# Patient Record
Sex: Male | Born: 1950 | Race: White | Hispanic: No | Marital: Married | Smoking: Never smoker
Health system: Southern US, Community
[De-identification: ages and names within clinical notes are randomized; demographics above are authoritative.]

## PROBLEM LIST (undated history)

## (undated) DIAGNOSIS — E785 Hyperlipidemia, unspecified: Secondary | ICD-10-CM

## (undated) DIAGNOSIS — I2510 Atherosclerotic heart disease of native coronary artery without angina pectoris: Secondary | ICD-10-CM

## (undated) DIAGNOSIS — I2584 Coronary atherosclerosis due to calcified coronary lesion: Secondary | ICD-10-CM

## (undated) HISTORY — PX: SKIN CANCER EXCISION: SHX779

## (undated) HISTORY — DX: Hyperlipidemia, unspecified: E78.5

## (undated) HISTORY — DX: Atherosclerotic heart disease of native coronary artery without angina pectoris: I25.10

## (undated) HISTORY — DX: Coronary atherosclerosis due to calcified coronary lesion: I25.84

---
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
## 2001-03-28 ENCOUNTER — Emergency Department (HOSPITAL_COMMUNITY): Admission: EM | Admit: 2001-03-28 | Discharge: 2001-03-28 | Payer: Self-pay | Admitting: Emergency Medicine

## 2005-01-13 ENCOUNTER — Ambulatory Visit (HOSPITAL_COMMUNITY): Admission: RE | Admit: 2005-01-13 | Discharge: 2005-01-13 | Payer: Self-pay | Admitting: Gastroenterology

## 2010-12-11 ENCOUNTER — Encounter: Payer: Self-pay | Admitting: Gastroenterology

## 2012-01-04 ENCOUNTER — Other Ambulatory Visit: Payer: Self-pay | Admitting: Dermatology

## 2013-01-08 ENCOUNTER — Other Ambulatory Visit: Payer: Self-pay | Admitting: Dermatology

## 2013-02-14 ENCOUNTER — Other Ambulatory Visit: Payer: Self-pay | Admitting: Gastroenterology

## 2013-02-14 DIAGNOSIS — R1310 Dysphagia, unspecified: Secondary | ICD-10-CM

## 2013-02-27 ENCOUNTER — Ambulatory Visit
Admission: RE | Admit: 2013-02-27 | Discharge: 2013-02-27 | Disposition: A | Discharge status: Home / Self Care | Payer: BC Managed Care – PPO | Source: Ambulatory Visit | Attending: Gastroenterology | Admitting: Gastroenterology

## 2013-02-27 DIAGNOSIS — R1310 Dysphagia, unspecified: Secondary | ICD-10-CM

## 2013-12-10 ENCOUNTER — Encounter (INDEPENDENT_AMBULATORY_CARE_PROVIDER_SITE_OTHER): Payer: Self-pay

## 2013-12-10 ENCOUNTER — Encounter (INDEPENDENT_AMBULATORY_CARE_PROVIDER_SITE_OTHER): Payer: Self-pay | Admitting: General Surgery

## 2013-12-10 ENCOUNTER — Ambulatory Visit (INDEPENDENT_AMBULATORY_CARE_PROVIDER_SITE_OTHER): Payer: BC Managed Care – PPO | Admitting: General Surgery

## 2013-12-10 VITALS — BP 142/88 | HR 80 | Temp 98.0°F | Resp 14 | Ht 68.0 in | Wt 146.6 lb

## 2013-12-10 DIAGNOSIS — K4090 Unilateral inguinal hernia, without obstruction or gangrene, not specified as recurrent: Secondary | ICD-10-CM

## 2013-12-10 NOTE — Progress Notes (Signed)
Patient ID: Roger PoppDale B Nerio, male   DOB: 12/18/1950, 63 y.o.   MRN: [id number]  Chief Complaint  Patient presents with  . New Evaluation    eval RIH    HPI Roger Lynch is a 63 y.o. male.  We are asked to see the patient in consultation by Dr. Rodrigo RanMark Perini to evaluate him for a right inguinal hernia. The patient is a 63 year old white male who frequently works out at [employer]. In December he was doing a abdominal work out and it was shortly after this that he noticed a bulge in the right groin. He has had some mild discomfort associated with it. He denies any nausea or vomiting. He has been wearing a truss to keep it in and this seems to have worked well for him HPI  Past Medical History  Diagnosis Date  . Hyperlipidemia     History reviewed. No pertinent past surgical history.  Family History  Problem Relation Age of Onset  . Heart disease Father   . Cancer Paternal Grandmother     stomach    Social History History  Substance Use Topics  . Smoking status: Never Smoker   . Smokeless tobacco: Never Used  . Alcohol Use: 0.6 oz/week    1 Glasses of wine per week     Comment: daily    Allergies  Allergen Reactions  . Erythromycin Nausea Only  . Hydrocodone Nausea Only    Current Outpatient Prescriptions  Medication Sig Dispense Refill  . aspirin 81 MG tablet Take 81 mg by mouth daily.      . Coenzyme Q10 (CO Q-10) 100 MG CAPS Take 200 mg by mouth.      Marland Kitchen. DHEA 50 MG TABS Take by mouth.      . Ginseng (GIN-ZING) 100 MG CAPS Take 200 mg by mouth.      . Multiple Vitamins-Minerals (MULTIVITAMIN PO) Take by mouth.      . Omega-3 Fatty Acids (FISH OIL) 1000 MG CAPS Take by mouth.      . Red Yeast Rice 600 MG TABS Take 1,200 mg by mouth.      . Zinc 100 MG TABS Take by mouth.       No current facility-administered medications for this visit.    Review of Systems Review of Systems  Constitutional: Negative.   HENT: Negative.   Eyes: Negative.   Respiratory: Negative.    Cardiovascular: Negative.   Gastrointestinal: Negative.   Endocrine: Negative.   Genitourinary: Negative.   Musculoskeletal: Negative.   Skin: Negative.   Allergic/Immunologic: Negative.   Neurological: Negative.   Hematological: Negative.   Psychiatric/Behavioral: Negative.     Blood pressure 142/88, pulse 80, temperature 98 F (36.7 C), temperature source Temporal, resp. rate 14, height 5\' 8"  (1.727 m), weight 146 lb 9.6 oz (66.497 kg).  Physical Exam Physical Exam  Constitutional: He is oriented to person, place, and time. He appears well-developed and well-nourished.  HENT:  Head: Normocephalic and atraumatic.  Eyes: Conjunctivae and EOM are normal. Pupils are equal, round, and reactive to light.  Neck: Normal range of motion. Neck supple.  Cardiovascular: Normal rate, regular rhythm and normal heart sounds.   Pulmonary/Chest: Effort normal and breath sounds normal.  Abdominal: Soft. Bowel sounds are normal.  Genitourinary:  There is a small but reducible right inguinal hernia. There is no palpable bulge or impulse with straining in the left groin.  Musculoskeletal: Normal range of motion.  Neurological: He is alert and oriented  to person, place, and time.  Skin: Skin is warm and dry.  Psychiatric: He has a normal mood and affect. His behavior is normal.    Data Reviewed As above  Assessment    The patient appears to have a small but symptomatic right inguinal hernia. Because of the risk of incarceration And strangulation I think he would benefit from having a hernia fixed. He would also like to have this done. I've discussed with him in detail the risks and benefits of the operation to fix the hernia as well some of the technical aspects and he understands and wishes to proceed     Plan    Plan for right inguinal hernia repair with mesh        TOTH III,PAUL S 12/10/2013, 12:17 PM

## 2014-01-08 ENCOUNTER — Other Ambulatory Visit: Payer: Self-pay | Admitting: Dermatology

## 2014-01-22 DIAGNOSIS — K4090 Unilateral inguinal hernia, without obstruction or gangrene, not specified as recurrent: Secondary | ICD-10-CM

## 2014-01-23 ENCOUNTER — Other Ambulatory Visit (INDEPENDENT_AMBULATORY_CARE_PROVIDER_SITE_OTHER): Payer: Self-pay | Admitting: *Deleted

## 2014-01-23 MED ORDER — OXYCODONE-ACETAMINOPHEN 5-325 MG PO TABS: 1.0000 | ORAL_TABLET | ORAL | Status: AC | PRN

## 2014-02-03 ENCOUNTER — Encounter (INDEPENDENT_AMBULATORY_CARE_PROVIDER_SITE_OTHER): Payer: BC Managed Care – PPO | Admitting: General Surgery

## 2014-11-21 HISTORY — PX: HERNIA REPAIR: SHX51

## 2015-03-13 ENCOUNTER — Other Ambulatory Visit: Payer: Self-pay | Admitting: Dermatology

## 2015-12-29 DIAGNOSIS — E784 Other hyperlipidemia: Secondary | ICD-10-CM | POA: Diagnosis not present

## 2016-03-23 DIAGNOSIS — H52223 Regular astigmatism, bilateral: Secondary | ICD-10-CM | POA: Diagnosis not present

## 2016-03-23 DIAGNOSIS — H5211 Myopia, right eye: Secondary | ICD-10-CM | POA: Diagnosis not present

## 2016-03-23 DIAGNOSIS — H1789 Other corneal scars and opacities: Secondary | ICD-10-CM | POA: Diagnosis not present

## 2016-03-23 DIAGNOSIS — H5202 Hypermetropia, left eye: Secondary | ICD-10-CM | POA: Diagnosis not present

## 2016-07-13 DIAGNOSIS — Z125 Encounter for screening for malignant neoplasm of prostate: Secondary | ICD-10-CM | POA: Diagnosis not present

## 2016-07-13 DIAGNOSIS — D2271 Melanocytic nevi of right lower limb, including hip: Secondary | ICD-10-CM | POA: Diagnosis not present

## 2016-07-13 DIAGNOSIS — Z85828 Personal history of other malignant neoplasm of skin: Secondary | ICD-10-CM | POA: Diagnosis not present

## 2016-07-13 DIAGNOSIS — D1801 Hemangioma of skin and subcutaneous tissue: Secondary | ICD-10-CM | POA: Diagnosis not present

## 2016-07-13 DIAGNOSIS — D2262 Melanocytic nevi of left upper limb, including shoulder: Secondary | ICD-10-CM | POA: Diagnosis not present

## 2016-07-13 DIAGNOSIS — L570 Actinic keratosis: Secondary | ICD-10-CM | POA: Diagnosis not present

## 2016-07-13 DIAGNOSIS — E784 Other hyperlipidemia: Secondary | ICD-10-CM | POA: Diagnosis not present

## 2016-07-13 DIAGNOSIS — D692 Other nonthrombocytopenic purpura: Secondary | ICD-10-CM | POA: Diagnosis not present

## 2016-07-13 DIAGNOSIS — L720 Epidermal cyst: Secondary | ICD-10-CM | POA: Diagnosis not present

## 2016-07-13 DIAGNOSIS — D225 Melanocytic nevi of trunk: Secondary | ICD-10-CM | POA: Diagnosis not present

## 2016-07-13 DIAGNOSIS — L821 Other seborrheic keratosis: Secondary | ICD-10-CM | POA: Diagnosis not present

## 2016-07-13 DIAGNOSIS — D2272 Melanocytic nevi of left lower limb, including hip: Secondary | ICD-10-CM | POA: Diagnosis not present

## 2016-07-13 DIAGNOSIS — R7301 Impaired fasting glucose: Secondary | ICD-10-CM | POA: Diagnosis not present

## 2016-07-20 DIAGNOSIS — N401 Enlarged prostate with lower urinary tract symptoms: Secondary | ICD-10-CM | POA: Diagnosis not present

## 2016-07-20 DIAGNOSIS — R0683 Snoring: Secondary | ICD-10-CM | POA: Diagnosis not present

## 2016-07-20 DIAGNOSIS — K219 Gastro-esophageal reflux disease without esophagitis: Secondary | ICD-10-CM | POA: Diagnosis not present

## 2016-07-20 DIAGNOSIS — E784 Other hyperlipidemia: Secondary | ICD-10-CM | POA: Diagnosis not present

## 2016-07-20 DIAGNOSIS — R7301 Impaired fasting glucose: Secondary | ICD-10-CM | POA: Diagnosis not present

## 2016-07-20 DIAGNOSIS — R011 Cardiac murmur, unspecified: Secondary | ICD-10-CM | POA: Diagnosis not present

## 2016-07-20 DIAGNOSIS — R601 Generalized edema: Secondary | ICD-10-CM | POA: Diagnosis not present

## 2016-07-20 DIAGNOSIS — Z0000 Encounter for general adult medical examination without abnormal findings: Secondary | ICD-10-CM | POA: Diagnosis not present

## 2016-07-20 DIAGNOSIS — N528 Other male erectile dysfunction: Secondary | ICD-10-CM | POA: Diagnosis not present

## 2016-07-20 DIAGNOSIS — G4709 Other insomnia: Secondary | ICD-10-CM | POA: Diagnosis not present

## 2016-07-22 DIAGNOSIS — Z1212 Encounter for screening for malignant neoplasm of rectum: Secondary | ICD-10-CM | POA: Diagnosis not present

## 2016-07-26 ENCOUNTER — Other Ambulatory Visit: Payer: Self-pay | Admitting: Internal Medicine

## 2016-07-26 DIAGNOSIS — R011 Cardiac murmur, unspecified: Secondary | ICD-10-CM

## 2016-08-04 ENCOUNTER — Other Ambulatory Visit (HOSPITAL_COMMUNITY): Payer: Self-pay

## 2016-08-15 ENCOUNTER — Ambulatory Visit (HOSPITAL_COMMUNITY): Payer: Medicare Other | Attending: Cardiovascular Disease

## 2016-08-15 ENCOUNTER — Other Ambulatory Visit: Payer: Self-pay

## 2016-08-15 DIAGNOSIS — I351 Nonrheumatic aortic (valve) insufficiency: Secondary | ICD-10-CM | POA: Insufficient documentation

## 2016-08-15 DIAGNOSIS — E785 Hyperlipidemia, unspecified: Secondary | ICD-10-CM | POA: Diagnosis not present

## 2016-08-15 DIAGNOSIS — Z8249 Family history of ischemic heart disease and other diseases of the circulatory system: Secondary | ICD-10-CM | POA: Insufficient documentation

## 2016-08-15 DIAGNOSIS — R011 Cardiac murmur, unspecified: Secondary | ICD-10-CM | POA: Diagnosis not present

## 2016-12-19 DIAGNOSIS — Z23 Encounter for immunization: Secondary | ICD-10-CM | POA: Diagnosis not present

## 2017-07-07 DIAGNOSIS — H52223 Regular astigmatism, bilateral: Secondary | ICD-10-CM | POA: Diagnosis not present

## 2017-07-07 DIAGNOSIS — H5213 Myopia, bilateral: Secondary | ICD-10-CM | POA: Diagnosis not present

## 2017-07-07 DIAGNOSIS — H43393 Other vitreous opacities, bilateral: Secondary | ICD-10-CM | POA: Diagnosis not present

## 2017-07-07 DIAGNOSIS — H2513 Age-related nuclear cataract, bilateral: Secondary | ICD-10-CM | POA: Diagnosis not present

## 2017-08-25 DIAGNOSIS — L821 Other seborrheic keratosis: Secondary | ICD-10-CM | POA: Diagnosis not present

## 2017-08-25 DIAGNOSIS — D2262 Melanocytic nevi of left upper limb, including shoulder: Secondary | ICD-10-CM | POA: Diagnosis not present

## 2017-08-25 DIAGNOSIS — C44519 Basal cell carcinoma of skin of other part of trunk: Secondary | ICD-10-CM | POA: Diagnosis not present

## 2017-08-25 DIAGNOSIS — D1801 Hemangioma of skin and subcutaneous tissue: Secondary | ICD-10-CM | POA: Diagnosis not present

## 2017-08-25 DIAGNOSIS — Z85828 Personal history of other malignant neoplasm of skin: Secondary | ICD-10-CM | POA: Diagnosis not present

## 2017-09-05 DIAGNOSIS — R7301 Impaired fasting glucose: Secondary | ICD-10-CM | POA: Diagnosis not present

## 2017-09-05 DIAGNOSIS — Z125 Encounter for screening for malignant neoplasm of prostate: Secondary | ICD-10-CM | POA: Diagnosis not present

## 2017-09-05 DIAGNOSIS — E7849 Other hyperlipidemia: Secondary | ICD-10-CM | POA: Diagnosis not present

## 2017-09-12 DIAGNOSIS — N401 Enlarged prostate with lower urinary tract symptoms: Secondary | ICD-10-CM | POA: Diagnosis not present

## 2017-09-12 DIAGNOSIS — R0683 Snoring: Secondary | ICD-10-CM | POA: Diagnosis not present

## 2017-09-12 DIAGNOSIS — Z0000 Encounter for general adult medical examination without abnormal findings: Secondary | ICD-10-CM | POA: Diagnosis not present

## 2017-09-12 DIAGNOSIS — R011 Cardiac murmur, unspecified: Secondary | ICD-10-CM | POA: Diagnosis not present

## 2017-09-25 DIAGNOSIS — Z1212 Encounter for screening for malignant neoplasm of rectum: Secondary | ICD-10-CM | POA: Diagnosis not present

## 2017-12-27 DIAGNOSIS — Z85828 Personal history of other malignant neoplasm of skin: Secondary | ICD-10-CM | POA: Diagnosis not present

## 2017-12-27 DIAGNOSIS — L570 Actinic keratosis: Secondary | ICD-10-CM | POA: Diagnosis not present

## 2018-03-22 DIAGNOSIS — E7849 Other hyperlipidemia: Secondary | ICD-10-CM | POA: Diagnosis not present

## 2018-08-15 DIAGNOSIS — H52223 Regular astigmatism, bilateral: Secondary | ICD-10-CM | POA: Diagnosis not present

## 2018-08-15 DIAGNOSIS — H43393 Other vitreous opacities, bilateral: Secondary | ICD-10-CM | POA: Diagnosis not present

## 2018-08-15 DIAGNOSIS — H5202 Hypermetropia, left eye: Secondary | ICD-10-CM | POA: Diagnosis not present

## 2018-08-15 DIAGNOSIS — H1789 Other corneal scars and opacities: Secondary | ICD-10-CM | POA: Diagnosis not present

## 2018-10-04 DIAGNOSIS — Z125 Encounter for screening for malignant neoplasm of prostate: Secondary | ICD-10-CM | POA: Diagnosis not present

## 2018-10-04 DIAGNOSIS — R82998 Other abnormal findings in urine: Secondary | ICD-10-CM | POA: Diagnosis not present

## 2018-10-04 DIAGNOSIS — R7301 Impaired fasting glucose: Secondary | ICD-10-CM | POA: Diagnosis not present

## 2018-10-04 DIAGNOSIS — E7849 Other hyperlipidemia: Secondary | ICD-10-CM | POA: Diagnosis not present

## 2018-10-10 DIAGNOSIS — Z85828 Personal history of other malignant neoplasm of skin: Secondary | ICD-10-CM | POA: Diagnosis not present

## 2018-10-10 DIAGNOSIS — L821 Other seborrheic keratosis: Secondary | ICD-10-CM | POA: Diagnosis not present

## 2018-10-10 DIAGNOSIS — L720 Epidermal cyst: Secondary | ICD-10-CM | POA: Diagnosis not present

## 2018-10-10 DIAGNOSIS — L738 Other specified follicular disorders: Secondary | ICD-10-CM | POA: Diagnosis not present

## 2018-10-11 ENCOUNTER — Other Ambulatory Visit: Payer: Self-pay | Admitting: Internal Medicine

## 2018-10-11 DIAGNOSIS — L570 Actinic keratosis: Secondary | ICD-10-CM | POA: Diagnosis not present

## 2018-10-11 DIAGNOSIS — E785 Hyperlipidemia, unspecified: Secondary | ICD-10-CM

## 2018-10-11 DIAGNOSIS — Z0000 Encounter for general adult medical examination without abnormal findings: Secondary | ICD-10-CM | POA: Diagnosis not present

## 2018-10-11 DIAGNOSIS — N401 Enlarged prostate with lower urinary tract symptoms: Secondary | ICD-10-CM | POA: Diagnosis not present

## 2018-10-11 DIAGNOSIS — Z1212 Encounter for screening for malignant neoplasm of rectum: Secondary | ICD-10-CM | POA: Diagnosis not present

## 2018-10-11 DIAGNOSIS — R600 Localized edema: Secondary | ICD-10-CM | POA: Diagnosis not present

## 2018-10-17 ENCOUNTER — Other Ambulatory Visit: Payer: Medicare Other

## 2018-10-22 ENCOUNTER — Ambulatory Visit
Admission: RE | Admit: 2018-10-22 | Discharge: 2018-10-22 | Disposition: A | Discharge status: Home / Self Care | Payer: No Typology Code available for payment source | Source: Ambulatory Visit | Attending: Internal Medicine | Admitting: Internal Medicine

## 2018-10-22 DIAGNOSIS — E785 Hyperlipidemia, unspecified: Secondary | ICD-10-CM

## 2018-10-24 DIAGNOSIS — I2510 Atherosclerotic heart disease of native coronary artery without angina pectoris: Secondary | ICD-10-CM | POA: Diagnosis not present

## 2018-10-24 DIAGNOSIS — R0989 Other specified symptoms and signs involving the circulatory and respiratory systems: Secondary | ICD-10-CM | POA: Diagnosis not present

## 2018-10-24 DIAGNOSIS — E782 Mixed hyperlipidemia: Secondary | ICD-10-CM | POA: Diagnosis not present

## 2018-10-24 DIAGNOSIS — I340 Nonrheumatic mitral (valve) insufficiency: Secondary | ICD-10-CM | POA: Diagnosis not present

## 2018-10-25 DIAGNOSIS — E559 Vitamin D deficiency, unspecified: Secondary | ICD-10-CM | POA: Diagnosis not present

## 2018-12-03 DIAGNOSIS — I2510 Atherosclerotic heart disease of native coronary artery without angina pectoris: Secondary | ICD-10-CM | POA: Diagnosis not present

## 2018-12-04 DIAGNOSIS — R011 Cardiac murmur, unspecified: Secondary | ICD-10-CM | POA: Diagnosis not present

## 2018-12-04 DIAGNOSIS — R0989 Other specified symptoms and signs involving the circulatory and respiratory systems: Secondary | ICD-10-CM | POA: Diagnosis not present

## 2018-12-10 DIAGNOSIS — I2510 Atherosclerotic heart disease of native coronary artery without angina pectoris: Secondary | ICD-10-CM | POA: Diagnosis not present

## 2018-12-10 DIAGNOSIS — I6523 Occlusion and stenosis of bilateral carotid arteries: Secondary | ICD-10-CM | POA: Diagnosis not present

## 2018-12-10 DIAGNOSIS — E782 Mixed hyperlipidemia: Secondary | ICD-10-CM | POA: Diagnosis not present

## 2018-12-10 DIAGNOSIS — I340 Nonrheumatic mitral (valve) insufficiency: Secondary | ICD-10-CM | POA: Diagnosis not present

## 2019-01-07 ENCOUNTER — Other Ambulatory Visit: Payer: Self-pay | Admitting: Cardiology

## 2019-01-07 DIAGNOSIS — E782 Mixed hyperlipidemia: Secondary | ICD-10-CM | POA: Diagnosis not present

## 2019-01-08 LAB — COMPREHENSIVE METABOLIC PANEL
ALK PHOS: 51 IU/L (ref 39–117)
ALT: 30 IU/L (ref 0–44)
AST: 28 IU/L (ref 0–40)
Albumin/Globulin Ratio: 2.7 — ABNORMAL HIGH (ref 1.2–2.2)
Albumin: 4.6 g/dL (ref 3.8–4.8)
BUN/Creatinine Ratio: 8 — ABNORMAL LOW (ref 10–24)
BUN: 9 mg/dL (ref 8–27)
Bilirubin Total: 0.4 mg/dL (ref 0.0–1.2)
CO2: 25 mmol/L (ref 20–29)
Calcium: 9.3 mg/dL (ref 8.6–10.2)
Chloride: 103 mmol/L (ref 96–106)
Creatinine, Ser: 1.09 mg/dL (ref 0.76–1.27)
GFR calc Af Amer: 80 mL/min/{1.73_m2} (ref >59)
GFR calc non Af Amer: 69 mL/min/{1.73_m2} (ref >59)
Globulin, Total: 1.7 g/dL (ref 1.5–4.5)
Glucose: 104 mg/dL — ABNORMAL HIGH (ref 65–99)
POTASSIUM: 4.6 mmol/L (ref 3.5–5.2)
Sodium: 143 mmol/L (ref 134–144)
Total Protein: 6.3 g/dL (ref 6.0–8.5)

## 2019-01-08 LAB — LIPID PANEL W/O CHOL/HDL RATIO
Cholesterol, Total: 123 mg/dL (ref 100–199)
HDL: 58 mg/dL (ref >39)
LDL Calculated: 52 mg/dL (ref 0–99)
Triglycerides: 67 mg/dL (ref 0–149)
VLDL Cholesterol Cal: 13 mg/dL (ref 5–40)

## 2019-01-08 LAB — LIPOPROTEIN A (LPA): LIPOPROTEIN (A): 11.9 nmol/L (ref <75.0)

## 2019-01-16 ENCOUNTER — Ambulatory Visit: Payer: Medicare Other | Admitting: Cardiology

## 2019-01-16 ENCOUNTER — Encounter: Payer: Self-pay | Admitting: Cardiology

## 2019-01-16 VITALS — BP 133/71 | HR 71 | Ht 68.0 in | Wt 159.0 lb

## 2019-01-16 DIAGNOSIS — I6523 Occlusion and stenosis of bilateral carotid arteries: Secondary | ICD-10-CM | POA: Diagnosis not present

## 2019-01-16 DIAGNOSIS — I2510 Atherosclerotic heart disease of native coronary artery without angina pectoris: Secondary | ICD-10-CM

## 2019-01-16 DIAGNOSIS — I340 Nonrheumatic mitral (valve) insufficiency: Secondary | ICD-10-CM

## 2019-01-16 DIAGNOSIS — E782 Mixed hyperlipidemia: Secondary | ICD-10-CM | POA: Diagnosis not present

## 2019-01-16 MED ORDER — ROSUVASTATIN CALCIUM 10 MG PO TABS: 10.0000 mg | ORAL_TABLET | Freq: Every day | ORAL | 2 refills | Status: AC

## 2019-01-16 MED ORDER — EZETIMIBE 10 MG PO TABS: 10.0000 mg | ORAL_TABLET | Freq: Every day | ORAL | 3 refills | Status: DC

## 2019-01-19 ENCOUNTER — Encounter: Payer: Self-pay | Admitting: Cardiology

## 2019-01-19 NOTE — Progress Notes (Signed)
Subjective:  Primary Physician:  Rodrigo Ran, MD  Patient ID: Roger Lynch, male    DOB: 03-18-1951, 68 y.o.   MRN: [id number]  Chief Complaint  Patient presents with  . Coronary Artery Disease    f/u tests  . Hyperlipidemia    HPI: Roger Lynch  is a 68 y.o. male  with coronary calcification noted artery CT scan of the chest performed for coronary risk stratification in view of strong family history of premature coronary artery disease (father who had MI at age 20 years) and also uncontrolled hyperlipidemia, previously not been able to tolerate statins causing him "fogginess in brain" and severe myalgias, vitamin D deficiency, h/o basal cell carcinoma S/P 5FU therapy in Jan 2019, erectile dysfunction and asymptomatic bilateral carotid artery stenosis presents here for three-month office visit and follow-up of hyperlipidemia specifically.  He is here also for continued risk factor modification.  He is essentially asymptomatic except since being back on statins has noticed vry mild myalgia and is accompanied by his wife.  Past Medical History:  Diagnosis Date  . Coronary artery calcification   . Hyperlipidemia     Past Surgical History:  Procedure Laterality Date  . HERNIA REPAIR  2016  . SKIN CANCER EXCISION      Social History   Socioeconomic History  . Marital status: Married    Spouse name: Not on file  . Number of children: Not on file  . Years of education: Not on file  . Highest education level: Not on file  Occupational History  . Not on file  Social Needs  . Financial resource strain: Not on file  . Food insecurity:    Worry: Not on file    Inability: Not on file  . Transportation needs:    Medical: Not on file    Non-medical: Not on file  Tobacco Use  . Smoking status: Never Smoker  . Smokeless tobacco: Never Used  Substance and Sexual Activity  . Alcohol use: Yes    Alcohol/week: 1.0 standard drinks    Types: 1 Glasses of wine per week    Comment:  daily  . Drug use: No  . Sexual activity: Not on file  Lifestyle  . Physical activity:    Days per week: Not on file    Minutes per session: Not on file  . Stress: Not on file  Relationships  . Social connections:    Talks on phone: Not on file    Gets together: Not on file    Attends religious service: Not on file    Active member of club or organization: Not on file    Attends meetings of clubs or organizations: Not on file    Relationship status: Not on file  . Intimate partner violence:    Fear of current or ex partner: Not on file    Emotionally abused: Not on file    Physically abused: Not on file    Forced sexual activity: Not on file  Other Topics Concern  . Not on file  Social History Narrative  . Not on file    Current Outpatient Medications on File Prior to Visit  Medication Sig Dispense Refill  . Acetylcysteine (N-ACETYL-L-CYSTEINE PO) Take 500 mg by mouth daily.    Marland Kitchen b complex vitamins tablet Take 1 tablet by mouth daily.    . Misc Natural Products (TART CHERRY ADVANCED PO) Take 1 capsule by mouth daily.    . NON FORMULARY Take 2  capsules by mouth daily. beta glucan    . NON FORMULARY Take 600 mg by mouth daily. L-Citrulline    . Omega-3 Fatty Acids (FISH OIL) 1000 MG CAPS Take by mouth.    Marland Kitchen aspirin 81 MG tablet Take 81 mg by mouth daily.     No current facility-administered medications on file prior to visit.      Review of Systems  Constitutional: Negative for malaise/fatigue and weight loss.  Respiratory: Negative for cough, hemoptysis and shortness of breath.   Cardiovascular: Negative for chest pain, palpitations, claudication and leg swelling.  Gastrointestinal: Negative for abdominal pain, blood in stool, constipation, heartburn and vomiting.  Genitourinary: Negative for dysuria.  Musculoskeletal: Positive for myalgias (mild). Negative for joint pain.  Neurological: Negative for dizziness, focal weakness and headaches.  Endo/Heme/Allergies: Does  not bruise/bleed easily.  Psychiatric/Behavioral: Negative for depression. The patient is not nervous/anxious.   All other systems reviewed and are negative.      Objective:  Blood pressure 133/71, pulse 71, height 5\' 8"  (1.727 m), weight 159 lb (72.1 kg), SpO2 96 %. Body mass index is 24.18 kg/m.  Physical Exam  Constitutional: He appears well-developed and well-nourished. No distress.  HENT:  Head: Atraumatic.  Eyes: Conjunctivae are normal.  Neck: Neck supple. No JVD present. No thyromegaly present.  Cardiovascular: Normal rate, regular rhythm and intact distal pulses. Exam reveals no gallop.  Murmur heard. High-pitched blowing midsystolic murmur is present with a grade of 2/6 at the apex. Pulses:      Carotid pulses are on the right side with bruit and on the left side with bruit. Pulmonary/Chest: Effort normal and breath sounds normal.  Abdominal: Soft. Bowel sounds are normal.  Musculoskeletal: Normal range of motion.        General: No edema.  Neurological: He is alert.  Skin: Skin is warm and dry.  Psychiatric: He has a normal mood and affect.    Lipid Panel     Component Value Date/Time   CHOL 123 01/07/2019 0810   TRIG 67 01/07/2019 0810   HDL 58 01/07/2019 0810   LDLCALC 52 01/07/2019 0810   CMP     Component Value Date/Time   NA 143 01/07/2019 0810   K 4.6 01/07/2019 0810   CL 103 01/07/2019 0810   CO2 25 01/07/2019 0810   GLUCOSE 104 (H) 01/07/2019 0810   BUN 9 01/07/2019 0810   CREATININE 1.09 01/07/2019 0810   CALCIUM 9.3 01/07/2019 0810   PROT 6.3 01/07/2019 0810   ALBUMIN 4.6 01/07/2019 0810   AST 28 01/07/2019 0810   ALT 30 01/07/2019 0810   ALKPHOS 51 01/07/2019 0810   BILITOT 0.4 01/07/2019 0810   GFRNONAA 69 01/07/2019 0810   GFRAA 80 01/07/2019 0810    CARDIAC STUDIES:   Exercise myoview stress 12/03/2018: 1. The patient performed treadmill exercise using Bruce protocol, completing 6:48 minutes. The patient completed an estimated  workload of 8.2 METS, reaching 91% of the maximum predicted heart rate. Normal hemodynamic response was seen. No stress symptoms reported. Exercise capacity was low. No ischemic changes seen on stress electrocardiogram. 2. The overall quality of the study is excellent. There is no evidence of abnormal lung activity. Stress and rest SPECT images demonstrate homogeneous tracer distribution throughout the myocardium. Gated SPECT imaging reveals normal myocardial thickening and wall motion. The left ventricular ejection fraction was normal (58%). 3. Low risk study.  Coronary Calcium CT 10/22/2018: 1. Total Agatston score of 30.2, corresponding to 87th percentile for  age, sex, and race based cohort. 2. Coronary calcium is nearly entirely positioned within the left main coronary artery. Consider cardiology consultation. 3. A 3 mm left lower lobe pulmonary nodule. No follow-up needed if patient is low-risk.  Carotid artery duplex 12/04/2018: Mild stenosis in the right external carotid artery (<50%). Stenosis in the left internal carotid artery (>=70%), peak velcity of 205/133 cm/Sec. Stenosis appears to be much more severe by Duplex. Antegrade right vertebral artery flow. Antegrade left vertebral artery flow. Follow up in six months is appropriate if clinically indicated.  Echocardiogram 12/04/2018: Left ventricle cavity is normal in size. Mild concentric hypertrophy of the left ventricle. Normal global wall motion. Doppler evidence of grade I (impaired) diastolic dysfunction, normal LAP. Calculated EF 61%. Mild aortic valve leaflet thickening. No evidence of aortic valve stenosis. Trileaflet aortic valve with mild to moderate regurgitation. Mild (Grade I) mitral regurgitation. Mild to moderate tricuspid regurgitation. No evidence of pulmonary hypertension.  Assessment & Recommendations:   1. Asymptomatic bilateral carotid artery stenosis - PCV CAROTID DUPLEX (BILATERAL); Future - Comprehensive  Metabolic Panel (CMET); Future  2. Coronary artery calcification seen on CAT scan - Comprehensive Metabolic Panel (CMET); Future  3. Mixed hyperlipidemia - Lipid Panel With LDL/HDL Ratio; Future - ezetimibe (ZETIA) 10 MG tablet; Take 1 tablet (10 mg total) by mouth daily. - rosuvastatin (CRESTOR) 10 MG tablet; Take 1 tablet (10 mg total) by mouth daily.  4. Mild mitral regurgitation  Recommendation:  Lipids are under excellent control, I'm extremely pleased with the effect that Zetia has had on reducing his LDL to goal.  He remains asymptomatic.  Carotid artery stenosis need to have surveillance Dopplers in 6 months.  He has not had any angina, he does have mild myalgias with statins, but he is willing to continue present medications.  I would like to repeat his labs in 6 months and see him back at that time.  No changes in the medications were done today. No clinical evidence of heart failure, no significant valvular heart disease.  Yates Decamp, MD, [facility] 01/19/2019, 6:15 PM [facility] [place] Pager: [phone] Office: [phone] If no answer Cell [phone]

## 2019-07-01 ENCOUNTER — Ambulatory Visit (INDEPENDENT_AMBULATORY_CARE_PROVIDER_SITE_OTHER): Payer: Medicare Other

## 2019-07-01 ENCOUNTER — Other Ambulatory Visit: Payer: Self-pay

## 2019-07-01 DIAGNOSIS — I6523 Occlusion and stenosis of bilateral carotid arteries: Secondary | ICD-10-CM

## 2019-07-08 ENCOUNTER — Other Ambulatory Visit: Payer: Self-pay | Admitting: Cardiology

## 2019-07-08 DIAGNOSIS — I6523 Occlusion and stenosis of bilateral carotid arteries: Secondary | ICD-10-CM | POA: Diagnosis not present

## 2019-07-08 DIAGNOSIS — E782 Mixed hyperlipidemia: Secondary | ICD-10-CM

## 2019-07-08 DIAGNOSIS — I2510 Atherosclerotic heart disease of native coronary artery without angina pectoris: Secondary | ICD-10-CM

## 2019-07-08 NOTE — Progress Notes (Signed)
Order's released

## 2019-07-09 LAB — COMPREHENSIVE METABOLIC PANEL
ALT: 27 IU/L (ref 0–44)
AST: 25 IU/L (ref 0–40)
Albumin/Globulin Ratio: 2.1 (ref 1.2–2.2)
Albumin: 4.5 g/dL (ref 3.8–4.8)
Alkaline Phosphatase: 63 IU/L (ref 39–117)
BUN/Creatinine Ratio: 12 (ref 10–24)
BUN: 12 mg/dL (ref 8–27)
Bilirubin Total: 0.5 mg/dL (ref 0.0–1.2)
CO2: 26 mmol/L (ref 20–29)
Calcium: 9.6 mg/dL (ref 8.6–10.2)
Chloride: 103 mmol/L (ref 96–106)
Creatinine, Ser: 1 mg/dL (ref 0.76–1.27)
GFR calc Af Amer: 88 mL/min/{1.73_m2} (ref >59)
GFR calc non Af Amer: 76 mL/min/{1.73_m2} (ref >59)
Globulin, Total: 2.1 g/dL (ref 1.5–4.5)
Glucose: 93 mg/dL (ref 65–99)
Potassium: 5.2 mmol/L (ref 3.5–5.2)
Sodium: 143 mmol/L (ref 134–144)
Total Protein: 6.6 g/dL (ref 6.0–8.5)

## 2019-07-09 LAB — LIPID PANEL WITH LDL/HDL RATIO
Cholesterol, Total: 151 mg/dL (ref 100–199)
HDL: 59 mg/dL (ref >39)
LDL Calculated: 76 mg/dL (ref 0–99)
LDl/HDL Ratio: 1.3 ratio (ref 0.0–3.6)
Triglycerides: 79 mg/dL (ref 0–149)
VLDL Cholesterol Cal: 16 mg/dL (ref 5–40)

## 2019-07-18 ENCOUNTER — Encounter: Payer: Self-pay | Admitting: Cardiology

## 2019-07-18 ENCOUNTER — Ambulatory Visit: Payer: Medicare Other | Admitting: Cardiology

## 2019-07-18 ENCOUNTER — Other Ambulatory Visit: Payer: Self-pay

## 2019-07-18 VITALS — BP 129/82 | HR 69 | Ht 68.0 in | Wt 157.0 lb

## 2019-07-18 DIAGNOSIS — E7800 Pure hypercholesterolemia, unspecified: Secondary | ICD-10-CM

## 2019-07-18 DIAGNOSIS — Z8249 Family history of ischemic heart disease and other diseases of the circulatory system: Secondary | ICD-10-CM

## 2019-07-18 DIAGNOSIS — E782 Mixed hyperlipidemia: Secondary | ICD-10-CM | POA: Diagnosis not present

## 2019-07-18 DIAGNOSIS — E785 Hyperlipidemia, unspecified: Secondary | ICD-10-CM | POA: Insufficient documentation

## 2019-07-18 DIAGNOSIS — I6523 Occlusion and stenosis of bilateral carotid arteries: Secondary | ICD-10-CM | POA: Diagnosis not present

## 2019-07-18 DIAGNOSIS — I2510 Atherosclerotic heart disease of native coronary artery without angina pectoris: Secondary | ICD-10-CM

## 2019-07-18 HISTORY — DX: Hyperlipidemia, unspecified: E78.5

## 2019-07-18 NOTE — Progress Notes (Signed)
Primary Physician/Referring:  Crist Infante, MD  Patient ID: Roger Lynch, male    DOB: 08/18/1951, 68 y.o.   MRN: [id number]  Chief Complaint  Patient presents with  . Hyperlipidemia  . Follow-up   HPI:    Roger Lynch  is a 68 y.o. Caucasian male  with coronary calcification noted artery CT scan of the chest performed for coronary risk stratification in view of strong family history of premature coronary artery disease (father who had MI at age 64 years) and also uncontrolled hyperlipidemia, previously not been able to tolerate statins causing him "fogginess in brain" and severe myalgias, vitamin D deficiency, h/o basal cell carcinoma S/P 5FU therapy in Jan 2019, erectile dysfunction and asymptomatic bilateral carotid artery stenosis presents here for three-month office visit and follow-up of hyperlipidemia specifically.  He is here also for continued risk factor modification.    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 He had developed similar symptoms with walnuts.   He still has very mild myalgia but otherwise tolerating statins. He is presently on 3-4 days of Crestor 10 mg a week along with Zetia similarly.  Past Medical History:  Diagnosis Date  . Coronary artery calcification   . HLD (hyperlipidemia) 07/18/2019  . Hyperlipidemia    Past Surgical History:  Procedure Laterality Date  . HERNIA REPAIR  2016  . SKIN CANCER EXCISION     Social History   Socioeconomic History  . Marital status: Married    Spouse name: Not on file  . Number of children: 2  . Years of education: Not on file  . Highest education level: Not on file  Occupational History  . Not on file  Social Needs  . Financial resource strain: Not on file  . Food insecurity    Worry: Not on file    Inability: Not on file  . Transportation needs    Medical: Not on file    Non-medical: Not on file  Tobacco Use   . Smoking status: Never Smoker  . Smokeless tobacco: Never Used  Substance and Sexual Activity  . Alcohol use: Yes    Alcohol/week: 1.0 standard drinks    Types: 1 Glasses of wine per week    Comment: daily  . Drug use: No  . Sexual activity: Not on file  Lifestyle  . Physical activity    Days per week: Not on file    Minutes per session: Not on file  . Stress: Not on file  Relationships  . Social Herbalist on phone: Not on file    Gets together: Not on file    Attends religious service: Not on file    Active member of club or organization: Not on file    Attends meetings of clubs or organizations: Not on file    Relationship status: Not on file  . Intimate partner violence    Fear of current or ex partner: Not on file    Emotionally abused: Not on file    Physically abused: Not on file    Forced sexual activity: Not on file  Other Topics Concern  . Not on file  Social History Narrative  . Not on file   ROS  Review of Systems  Constitution: Negative for chills, decreased appetite, malaise/fatigue and weight gain.  HENT:       Burning mouth   Cardiovascular: Negative  for dyspnea on exertion, leg swelling and syncope.  Endocrine: Negative for cold intolerance.  Hematologic/Lymphatic: Does not bruise/bleed easily.  Musculoskeletal: Positive for muscle cramps. Negative for joint swelling.  Gastrointestinal: Negative for abdominal pain, anorexia, change in bowel habit, hematochezia and melena.  Neurological: Negative for headaches and light-headedness.  Psychiatric/Behavioral: Negative for depression and substance abuse.  All other systems reviewed and are negative.  Objective  Blood pressure 129/82, pulse 69, height 5\' 8"  (1.727 m), weight 157 lb (71.2 kg), SpO2 96 %. Body mass index is 23.87 kg/m.   Physical Exam  Constitutional: He appears well-developed and well-nourished. No distress.  HENT:  Head: Atraumatic.  Eyes: Conjunctivae are normal.  Neck:  Neck supple. No JVD present. No thyromegaly present.  Cardiovascular: Normal rate, regular rhythm and intact distal pulses. Exam reveals no gallop.  Murmur heard. High-pitched blowing midsystolic murmur is present with a grade of 2/6 at the apex. Pulses:      Carotid pulses are on the right side with bruit and on the left side with bruit. No edema.  Pulmonary/Chest: Effort normal and breath sounds normal.  Abdominal: Soft. Bowel sounds are normal.  Musculoskeletal: Normal range of motion.        General: No edema.  Neurological: He is alert.  Skin: Skin is warm and dry.  Psychiatric: He has a normal mood and affect.   Radiology: No results found.  Laboratory examination:   Recent Labs    01/07/19 0810 07/08/19 0000  NA 143 143  K 4.6 5.2  CL 103 103  CO2 25 26  GLUCOSE 104* 93  BUN 9 12  CREATININE 1.09 1.00  CALCIUM 9.3 9.6  GFRNONAA 69 76  GFRAA 80 88   CMP Latest Ref Rng & Units 07/08/2019 01/07/2019  Glucose 65 - 99 mg/dL 93 283(T)  BUN 8 - 27 mg/dL 12 9  Creatinine 5.17 - 1.27 mg/dL 6.16 0.73  Sodium 710 - 144 mmol/L 143 143  Potassium 3.5 - 5.2 mmol/L 5.2 4.6  Chloride 96 - 106 mmol/L 103 103  CO2 20 - 29 mmol/L 26 25  Calcium 8.6 - 10.2 mg/dL 9.6 9.3  Total Protein 6.0 - 8.5 g/dL 6.6 6.3  Total Bilirubin 0.0 - 1.2 mg/dL 0.5 0.4  Alkaline Phos 39 - 117 IU/L 63 51  AST 0 - 40 IU/L 25 28  ALT 0 - 44 IU/L 27 30   No flowsheet data found. Lipid Panel     Component Value Date/Time   CHOL 151 07/08/2019 0000   TRIG 79 07/08/2019 0000   HDL 59 07/08/2019 0000   LDLCALC 76 07/08/2019 0000   HEMOGLOBIN A1C No results found for: HGBA1C, MPG TSH No results for input(s): TSH in the last 8760 hours. Medications   Prior to Admission medications   Medication Sig Start Date End Date Taking? Authorizing Provider  Acetylcysteine (N-ACETYL-L-CYSTEINE PO) Take 500 mg by mouth daily.    [provider]  aspirin 81 MG tablet Take 81 mg by mouth daily.     [provider]  b complex vitamins tablet Take 1 tablet by mouth daily.    [provider]  ezetimibe (ZETIA) 10 MG tablet Take 1 tablet (10 mg total) by mouth daily. 01/16/19   Yates Decamp, MD  Misc Natural Products (TART CHERRY ADVANCED PO) Take 1 capsule by mouth daily.    [provider]  NON FORMULARY Take 2 capsules by mouth daily. beta glucan    [provider]  NON FORMULARY Take 600 mg by mouth daily. L-Citrulline    [provider]  Omega-3 Fatty Acids (FISH OIL) 1000 MG CAPS Take by mouth.    [provider]  rosuvastatin (CRESTOR) 10 MG tablet Take 1 tablet (10 mg total) by mouth daily. 01/16/19   Yates DecampGanji, Shacarra Choe, MD     Current Outpatient Medications  Medication Instructions  . aspirin 81 mg, Daily  . b complex vitamins tablet 1 tablet, Oral, Daily  . ezetimibe (ZETIA) 10 mg, Oral, Daily  . Misc Natural Products (TART CHERRY ADVANCED PO) 1 capsule, Oral, Daily  . NON FORMULARY 2 capsules, Oral, Daily, beta glucan   . NON FORMULARY 600 mg, Oral, Daily, L-Citrulline   . Omega-3 Fatty Acids (FISH OIL) 1000 MG CAPS Take by mouth.  . rosuvastatin (CRESTOR) 10 mg, Oral, Daily    Cardiac Studies:   Exercise myoview stress 12/03/2018: 1. The patient performed treadmill exercise using Bruce protocol, completing 6:48 minutes. The patient completed an estimated workload of 8.2 METS, reaching 91% of the maximum predicted heart rate. Normal hemodynamic response was seen. No stress symptoms reported. Exercise capacity was low. No ischemic changes seen on stress electrocardiogram. 2. The overall quality of the study is excellent. There is no evidence of abnormal lung activity. Stress and rest SPECT images demonstrate homogeneous tracer distribution throughout the myocardium. Gated SPECT imaging reveals normal myocardial thickening and wall motion. The left ventricular ejection fraction was normal (58%). 3. Low risk study.  Coronary Calcium CT  10/22/2018: 1. Total Agatston score of 30.2, corresponding to 87th percentile for age, sex, and race based cohort. 2. Coronary calcium is nearly entirely positioned within the left main coronary artery. Consider cardiology consultation. 3. A 3 mm left lower lobe pulmonary nodule. No follow-up needed if patient is low-risk.  Echocardiogram 12/04/2018: Left ventricle cavity is normal in size. Mild concentric hypertrophy of the left ventricle. Normal global wall motion. Doppler evidence of grade I (impaired) diastolic dysfunction, normal LAP. Calculated EF 61%. Mild aortic valve leaflet thickening. No evidence of aortic valve stenosis. Trileaflet aortic valve with mild to moderate regurgitation. Mild (Grade I) mitral regurgitation. Mild to moderate tricuspid regurgitation. No evidence of pulmonary hypertension.  Carotid artery duplex  07/01/2019: Mild heterogeneous plaque bilateral carotid arteries without hemodynamically significant stenosis. Antegrade bilateral vertebral artery flow.  Increased velocities (>70% stenosis) from1/14/2020 not visualized today; left ICA appears normal. Previously left ICA velocity was 205/132 cm/S. Consider repeat study in 6 months or different modality imaging.   Assessment     ICD-10-CM   1. Carotid atherosclerosis, bilateral  I65.23 EKG 12-Lead    PCV CAROTID DUPLEX (BILATERAL)  2. Coronary artery calcification seen on CAT scan  I25.10 EKG 12-Lead  3. Mixed hyperlipidemia  E78.2   4. Family history of premature CAD  24Z82.49    (father who had MI at age 70 years)   5. Pure hypercholesterolemia  E78.00     EKG 07/18/2019: Normal sinus rhythm at rate of 63 bpm, normal axis.  No evidence of ischemia, normal EKG.  Recommendations:   I reviewed his labs, lipids are under excellent control.  Blood pressure is also normal.  He is on appropriate medical therapy.  He seems to think he is done burning mouth syndrome which is a side effect of Zetia and also statins,  advised him that he could discontinue both for 2 weeks to see whether symptoms would improve.  He has had similar symptoms of burning sensation in the mouth with  walnuts and he may be in fact having some form of allergy to some food.  With regard to carotid artery stenosis, although the present carotid duplex does not reveal high-grade stenosis, in view of very high-grade left carotid stenosis noted previously, I would like to confirm this by repeating duplex in 6 months.  Otherwise he is on appropriate medical therapy, continue aspirin for prophylaxis, would like to see him back in 6 months with repeat carotid artery duplex.  Yates DecampJay Harla Mensch, MD, [facility] 07/18/2019, 9:33 AM [facility] [place] Pager: [phone] Office: [phone] If no answer Cell [phone]

## 2019-10-25 DIAGNOSIS — E7849 Other hyperlipidemia: Secondary | ICD-10-CM | POA: Diagnosis not present

## 2019-10-25 DIAGNOSIS — Z125 Encounter for screening for malignant neoplasm of prostate: Secondary | ICD-10-CM | POA: Diagnosis not present

## 2019-10-25 DIAGNOSIS — R7301 Impaired fasting glucose: Secondary | ICD-10-CM | POA: Diagnosis not present

## 2019-10-28 DIAGNOSIS — R82998 Other abnormal findings in urine: Secondary | ICD-10-CM | POA: Diagnosis not present

## 2019-11-01 DIAGNOSIS — I351 Nonrheumatic aortic (valve) insufficiency: Secondary | ICD-10-CM | POA: Diagnosis not present

## 2019-11-01 DIAGNOSIS — I6529 Occlusion and stenosis of unspecified carotid artery: Secondary | ICD-10-CM | POA: Diagnosis not present

## 2019-11-01 DIAGNOSIS — I2510 Atherosclerotic heart disease of native coronary artery without angina pectoris: Secondary | ICD-10-CM | POA: Diagnosis not present

## 2019-11-01 DIAGNOSIS — Z0000 Encounter for general adult medical examination without abnormal findings: Secondary | ICD-10-CM | POA: Diagnosis not present

## 2019-11-06 DIAGNOSIS — Z1212 Encounter for screening for malignant neoplasm of rectum: Secondary | ICD-10-CM | POA: Diagnosis not present

## 2019-12-19 DIAGNOSIS — C44519 Basal cell carcinoma of skin of other part of trunk: Secondary | ICD-10-CM | POA: Diagnosis not present

## 2019-12-19 DIAGNOSIS — D2262 Melanocytic nevi of left upper limb, including shoulder: Secondary | ICD-10-CM | POA: Diagnosis not present

## 2019-12-19 DIAGNOSIS — Z85828 Personal history of other malignant neoplasm of skin: Secondary | ICD-10-CM | POA: Diagnosis not present

## 2019-12-19 DIAGNOSIS — D171 Benign lipomatous neoplasm of skin and subcutaneous tissue of trunk: Secondary | ICD-10-CM | POA: Diagnosis not present

## 2019-12-19 DIAGNOSIS — L821 Other seborrheic keratosis: Secondary | ICD-10-CM | POA: Diagnosis not present

## 2019-12-22 IMAGING — CT CT HEART SCORING
3 series · 14 of 20 positions shown, 16 images · non-contrast
Comparison: None.

CLINICAL DATA: Elevated cholesterol.  Not on meds.

EXAM:
CT HEART FOR CALCIUM SCORING
TECHNIQUE: CT heart was performed on a 64 channel system using prospective ECG
gating.
A non-contrast exam for calcium scoring was performed.
Note that this exam targets the heart and the chest was not imaged
in its entirety.

[Series 2: calcium scoring 2.00 qr36 bestdiast 69% · axial · 0.36mm/px · z∈[+1682,+1766]mm · 4 of 70 slices shown]
[im 14/70  vessel]
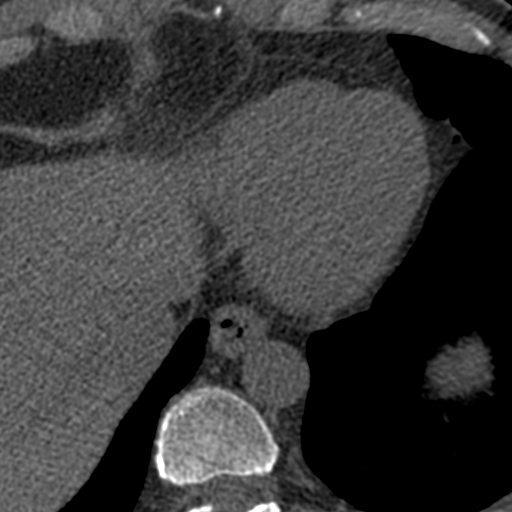
[im 28/70  vessel]
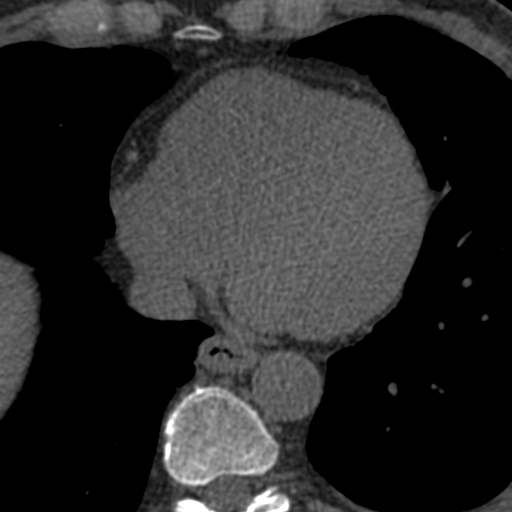
[im 42/70  vessel]
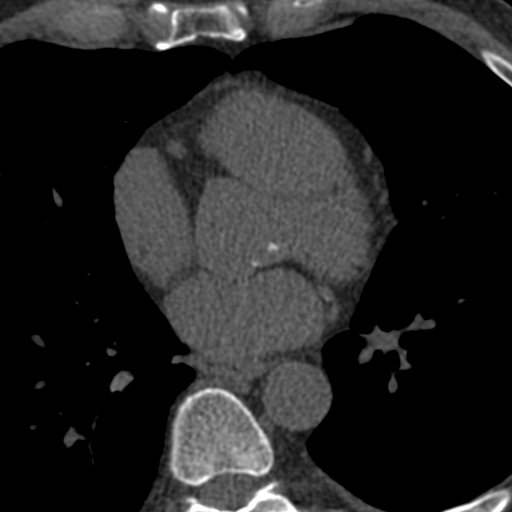
[im 56/70  vessel]
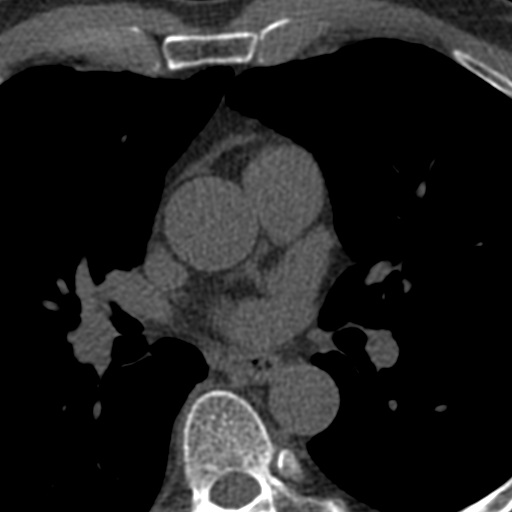

[Series 3: calcium scoring 2.00 br40 bestdiast 69% ax fov · axial · 0.56mm/px · z∈[+1678,+1770]mm · 5 of 70 slices shown, 7 images]
[im 12/70  vessel]
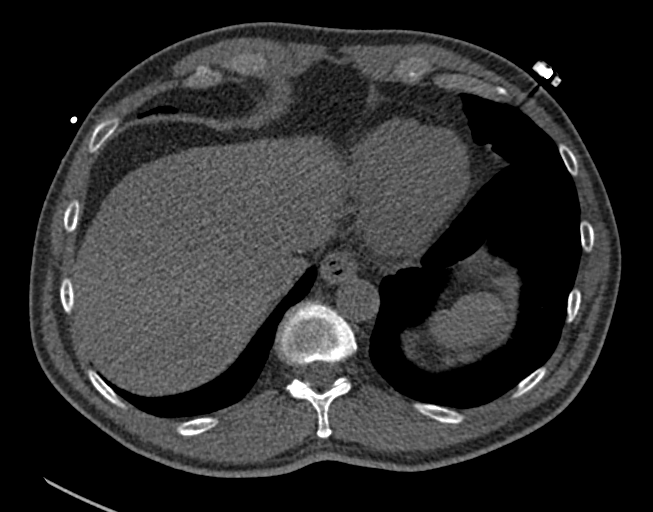
[im 12/70  lung]
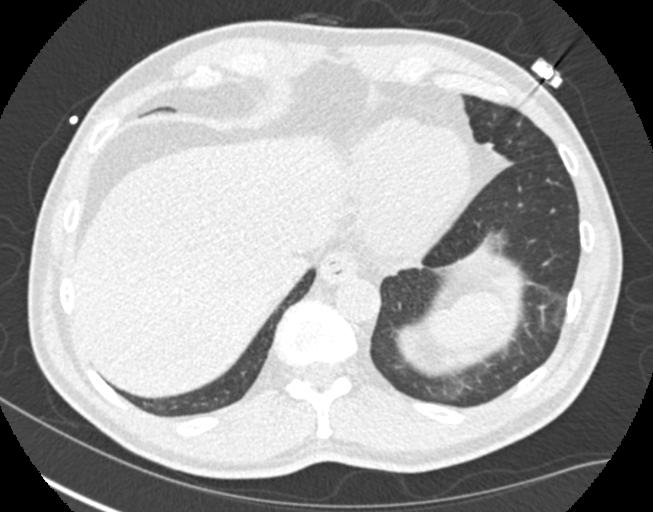
[im 24/70  vessel]
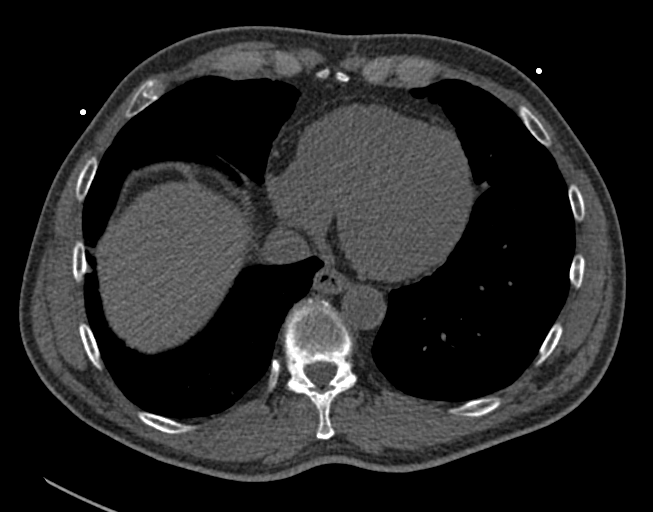
[im 35/70  vessel]
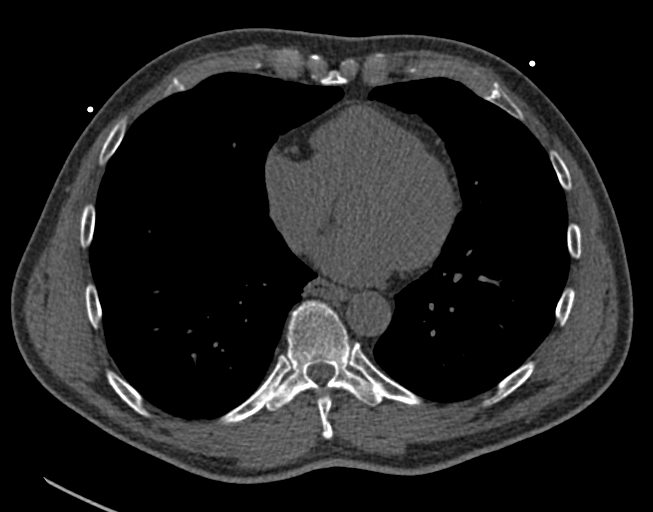
[im 47/70  vessel]
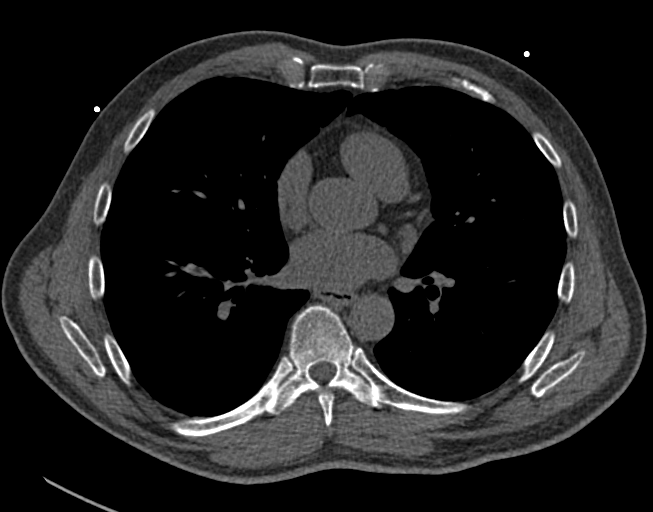
[im 58/70  vessel]
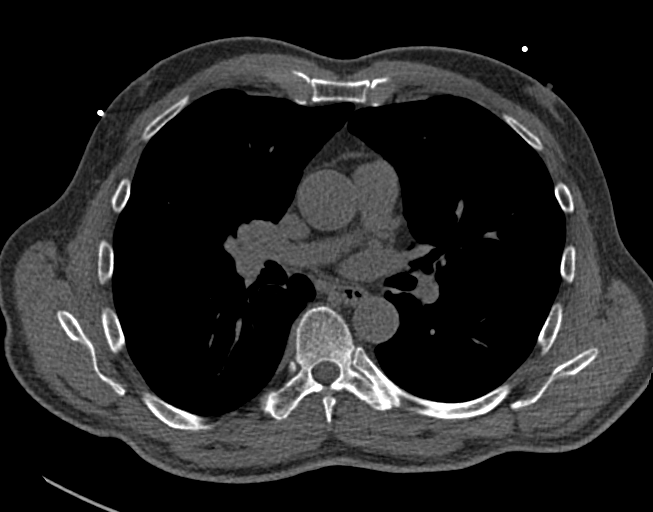
[im 58/70  lung]
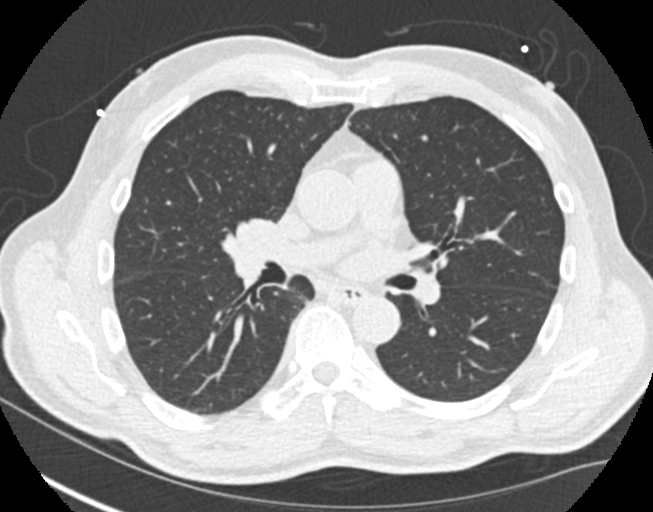

[Series 9: calcium scoring 2.00 br60 bestdiast 69% ax fov · axial · 0.56mm/px · z∈[+1678,+1770]mm · 5 of 70 slices shown]
[im 12/70  vessel]
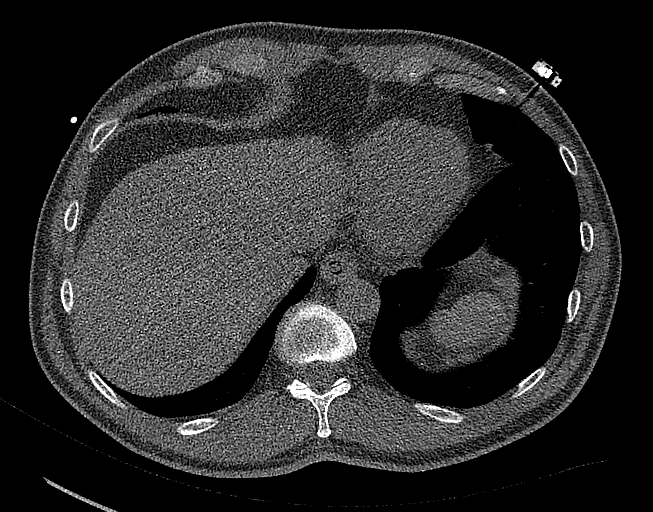
[im 24/70  vessel]
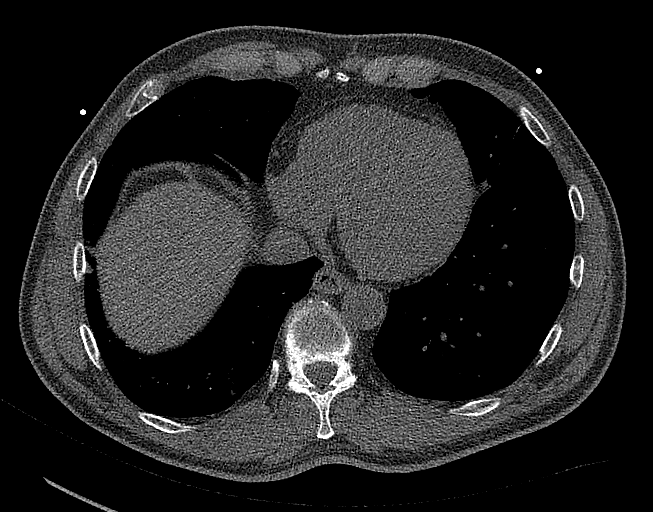
[im 35/70  vessel]
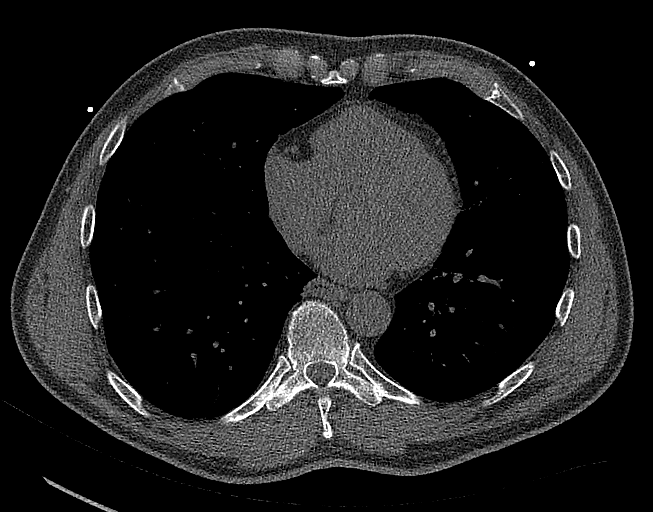
[im 47/70  vessel]
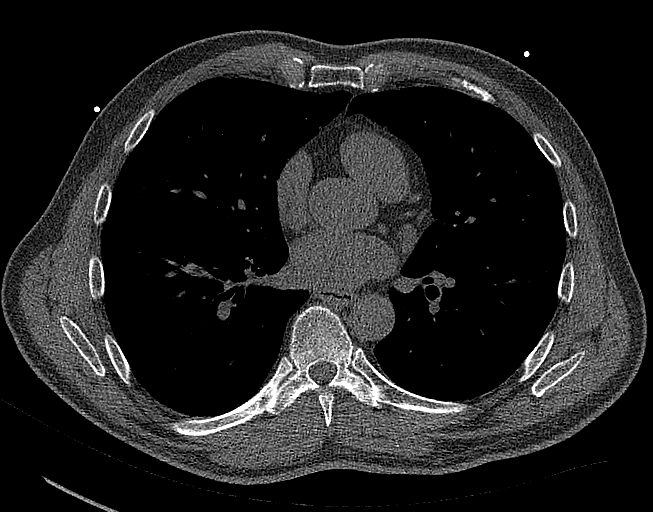
[im 58/70  vessel]
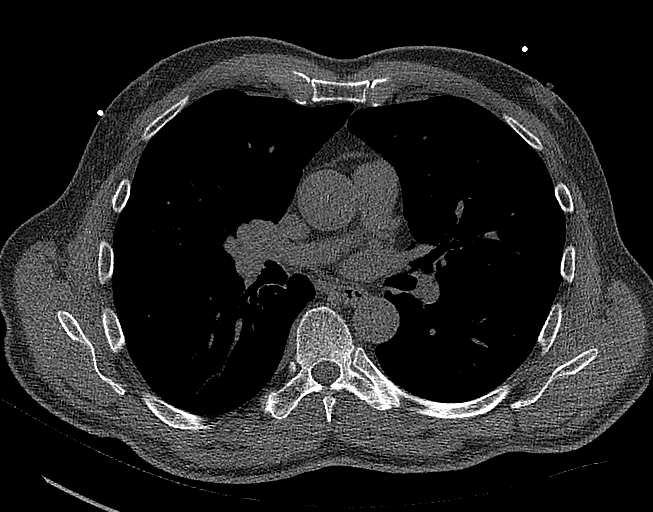

[14 of 20 positions shown; findings below may reference images not displayed]

FINDINGS: Technical quality: Good.

CORONARY CALCIUM

Total Agatston Score: 30.2-coronary calcium is nearly entirely
positioned within the superior portion of the left main coronary
artery, including image [DATE] and image 60/5.

[HOSPITAL] percentile:  87th

OTHER FINDINGS:

Cardiovascular: Normal aortic caliber. Normal heart size, without
pericardial effusion.

Mediastinum/Nodes: No imaged thoracic adenopathy.

Lungs/Pleura: No imaged pleural fluid. Bibasilar scarring 3 mm left
lower lobe pulmonary nodule on image 65/9.

Upper Abdomen: Normal imaged portions of the liver, spleen, stomach.

Musculoskeletal: No acute osseous abnormality.
IMPRESSION: 1. Total Agatston score of 30.2, corresponding to 87th percentile
for age, sex, and race based cohort.
2. Coronary calcium is nearly entirely positioned within the left
main coronary artery. Consider cardiology consultation.
3. 3 mm left lower lobe pulmonary nodule. No follow-up needed if
patient is low-risk. Non-contrast chest CT can be considered in 12
months if patient is high-risk. This recommendation follows the
consensus statement: Guidelines for Management of Incidental
Pulmonary Nodules Detected on CT Images: From the [HOSPITAL]

These results will be called to the ordering clinician or
representative by the Radiologist Assistant, and communication
documented in the PACS or zVision Dashboard.

## 2020-01-09 ENCOUNTER — Other Ambulatory Visit: Payer: Medicare Other

## 2020-01-10 ENCOUNTER — Other Ambulatory Visit: Payer: Medicare Other

## 2020-01-14 ENCOUNTER — Ambulatory Visit: Payer: Medicare Other | Admitting: Cardiology

## 2020-01-17 ENCOUNTER — Ambulatory Visit: Payer: Medicare Other

## 2020-01-17 ENCOUNTER — Ambulatory Visit: Payer: Medicare Other | Admitting: Cardiology

## 2020-01-17 ENCOUNTER — Other Ambulatory Visit: Payer: Self-pay

## 2020-01-17 DIAGNOSIS — I6523 Occlusion and stenosis of bilateral carotid arteries: Secondary | ICD-10-CM

## 2020-01-29 ENCOUNTER — Other Ambulatory Visit: Payer: Self-pay

## 2020-01-29 ENCOUNTER — Encounter: Payer: Self-pay | Admitting: Cardiology

## 2020-01-29 ENCOUNTER — Ambulatory Visit: Payer: Medicare Other | Admitting: Cardiology

## 2020-01-29 VITALS — BP 131/73 | HR 68 | Temp 97.0°F | Resp 16 | Ht 68.0 in | Wt 164.0 lb

## 2020-01-29 DIAGNOSIS — R0989 Other specified symptoms and signs involving the circulatory and respiratory systems: Secondary | ICD-10-CM

## 2020-01-29 DIAGNOSIS — E7800 Pure hypercholesterolemia, unspecified: Secondary | ICD-10-CM

## 2020-01-29 DIAGNOSIS — I340 Nonrheumatic mitral (valve) insufficiency: Secondary | ICD-10-CM | POA: Diagnosis not present

## 2020-01-29 DIAGNOSIS — I2510 Atherosclerotic heart disease of native coronary artery without angina pectoris: Secondary | ICD-10-CM | POA: Diagnosis not present

## 2020-01-29 MED ORDER — EZETIMIBE 10 MG PO TABS: 10.0000 mg | ORAL_TABLET | Freq: Every day | ORAL | 2 refills | Status: DC

## 2020-01-29 NOTE — Progress Notes (Signed)
Primary Physician/Referring:  Rodrigo Ran, MD  Patient ID: Roger Lynch, male    DOB: Nov 25, 1950, 69 y.o.   MRN: [id number]  Chief Complaint  Patient presents with  . Coronary Artery Disease  . Hypertension  . Follow-up    6 month    HPI:    Roger Lynch  is a 69 y.o. Caucasian male  with coronary calcification noted artery CT scan of the chest performed for coronary risk stratification in view of strong family history of premature coronary artery disease (father who had MI at age 52 years) and also uncontrolled hyperlipidemia, previously not been able to tolerate statins causing him "fogginess in brain" and severe myalgias, vitamin D deficiency, h/o basal cell carcinoma S/P 5FU therapy in Jan 2019, erectile dysfunction  Presents for a 56-month office visit and follow-up of hyperlipidemia specifically.  He is here also for continued risk factor modification.    He still has very mild myalgia but otherwise tolerating statins. He is presently on 3-4 days of Crestor 10 mg a week along with Zetia similarly.  He is really doing well, remains asymptomatic, presently tolerating Crestor but discontinued Zetia.  Past Medical History:  Diagnosis Date  . Coronary artery calcification   . HLD (hyperlipidemia) 07/18/2019  . Hyperlipidemia    Past Surgical History:  Procedure Laterality Date  . HERNIA REPAIR  2016  . SKIN CANCER EXCISION     Social History   Socioeconomic History  . Marital status: Married    Spouse name: Not on file  . Number of children: 2  . Years of education: Not on file  . Highest education level: Not on file  Occupational History  . Not on file  Tobacco Use  . Smoking status: Never Smoker  . Smokeless tobacco: Never Used  Substance and Sexual Activity  . Alcohol use: Yes    Alcohol/week: 1.0 standard drinks    Types: 1 Glasses of wine per week    Comment: daily  . Drug use: No  . Sexual activity: Not on file  Other Topics Concern  . Not on file  Social  History Narrative  . Not on file   Social Determinants of Health   Financial Resource Strain:   . Difficulty of Paying Living Expenses: Not on file  Food Insecurity:   . Worried About Programme researcher, broadcasting/film/video in the Last Year: Not on file  . Ran Out of Food in the Last Year: Not on file  Transportation Needs:   . Lack of Transportation (Medical): Not on file  . Lack of Transportation (Non-Medical): Not on file  Physical Activity:   . Days of Exercise per Week: Not on file  . Minutes of Exercise per Session: Not on file  Stress:   . Feeling of Stress : Not on file  Social Connections:   . Frequency of Communication with Friends and Family: Not on file  . Frequency of Social Gatherings with Friends and Family: Not on file  . Attends Religious Services: Not on file  . Active Member of Clubs or Organizations: Not on file  . Attends Banker Meetings: Not on file  . Marital Status: Not on file  Intimate Partner Violence:   . Fear of Current or Ex-Partner: Not on file  . Emotionally Abused: Not on file  . Physically Abused: Not on file  . Sexually Abused: Not on file   ROS  Review of Systems  Cardiovascular: Negative for chest pain, dyspnea on  exertion and leg swelling.  Musculoskeletal: Negative for muscle cramps.  Gastrointestinal: Negative for melena.   Objective  Blood pressure 131/73, pulse 68, temperature (!) 97 F (36.1 C), temperature source Temporal, resp. rate 16, height 5\' 8"  (1.727 m), weight 164 lb (74.4 kg), SpO2 97 %. Body mass index is 24.94 kg/m.   Physical Exam  Constitutional: He appears well-developed and well-nourished. No distress.  Cardiovascular: Normal rate, regular rhythm and intact distal pulses. Exam reveals no gallop.  Murmur heard. High-pitched blowing midsystolic murmur is present with a grade of 2/6 at the apex. Pulses:      Carotid pulses are on the right side with bruit and on the left side with bruit. No edema. No JVD.    Pulmonary/Chest: Effort normal and breath sounds normal.  Abdominal: Soft. Bowel sounds are normal.   Radiology: No results found.  Laboratory examination:   Recent Labs    07/08/19 0000  NA 143  K 5.2  CL 103  CO2 26  GLUCOSE 93  BUN 12  CREATININE 1.00  CALCIUM 9.6  GFRNONAA 76  GFRAA 88   CMP Latest Ref Rng & Units 07/08/2019 01/07/2019  Glucose 65 - 99 mg/dL 93 104(H)  BUN 8 - 27 mg/dL 12 9  Creatinine 0.76 - 1.27 mg/dL 1.00 1.09  Sodium 134 - 144 mmol/L 143 143  Potassium 3.5 - 5.2 mmol/L 5.2 4.6  Chloride 96 - 106 mmol/L 103 103  CO2 20 - 29 mmol/L 26 25  Calcium 8.6 - 10.2 mg/dL 9.6 9.3  Total Protein 6.0 - 8.5 g/dL 6.6 6.3  Total Bilirubin 0.0 - 1.2 mg/dL 0.5 0.4  Alkaline Phos 39 - 117 IU/L 63 51  AST 0 - 40 IU/L 25 28  ALT 0 - 44 IU/L 27 30   No flowsheet data found. Lipid Panel     Component Value Date/Time   CHOL 151 07/08/2019 0000   TRIG 79 07/08/2019 0000   HDL 59 07/08/2019 0000   LDLCALC 76 07/08/2019 0000   HEMOGLOBIN A1C No results found for: HGBA1C, MPG TSH No results for input(s): TSH in the last 8760 hours.   External records: Cholesterol, total 160.000 m 10/25/2019 HDL 53 MG/DL 10/25/2019 LDL 93.000 mg 10/25/2019 Triglycerides 71.000 10/25/2019  A1C 5.300 % 10/25/2019; TSH 1.430 10/25/2019  Hemoglobin 17.000 g/ 10/25/2019 Creatinine, Serum 0.900 mg/ 10/25/2019 Potassium 5.200 07/08/2019 ALT (SGPT) 23.000 uni 10/25/2019  Medications   Current Outpatient Medications  Medication Instructions  . aspirin 81 mg, Daily  . b complex vitamins tablet 1 tablet, Oral, Daily  . ezetimibe (ZETIA) 10 mg, Oral, Daily after supper  . Misc Natural Products (TART CHERRY ADVANCED PO) 1 capsule, Oral, Daily  . NON FORMULARY 2 capsules, Oral, Daily, beta glucan   . NON FORMULARY 600 mg, Oral, Daily, L-Citrulline   . Omega-3 Fatty Acids (FISH OIL) 1000 MG CAPS Take by mouth.  . rosuvastatin (CRESTOR) 10 mg, Oral, Daily    Cardiac Studies:    Exercise myoview stress 12/03/2018: 1. The patient performed treadmill exercise using Bruce protocol, completing 6:48 minutes. The patient completed an estimated workload of 8.2 METS, reaching 91% of the maximum predicted heart rate. Normal hemodynamic response was seen. No stress symptoms reported. Exercise capacity was low. No ischemic changes seen on stress electrocardiogram. 2. The overall quality of the study is excellent. There is no evidence of abnormal lung activity. Stress and rest SPECT images demonstrate homogeneous tracer distribution throughout the myocardium. Gated SPECT imaging reveals normal myocardial  thickening and wall motion. The left ventricular ejection fraction was normal (58%). 3. Low risk study.  Coronary Calcium CT 10/22/2018: 1. Total Agatston score of 30.2, corresponding to 87th percentile for age, sex, and race based cohort. 2. Coronary calcium is nearly entirely positioned within the left main coronary artery. Consider cardiology consultation. 3. A 3 mm left lower lobe pulmonary nodule. No follow-up needed if patient is low-risk.  Echocardiogram 12/04/2018: Left ventricle cavity is normal in size. Mild concentric hypertrophy of the left ventricle. Normal global wall motion. Doppler evidence of grade I (impaired) diastolic dysfunction, normal LAP. Calculated EF 61%. Mild aortic valve leaflet thickening. No evidence of aortic valve stenosis. Trileaflet aortic valve with mild to moderate regurgitation. Mild (Grade I) mitral regurgitation. Mild to moderate tricuspid regurgitation. No evidence of pulmonary hypertension.  Carotid artery duplex 01/17/2020:  No hemodynamically significant arterial disease in the internal carotid  artery bilaterally.   Bilateral vertebral artery flow is antegrade.  No significant change from 07/01/2019.  Assessment     ICD-10-CM   1. Bilateral carotid bruits  R09.89 EKG 12-Lead  2. Pure hypercholesterolemia  E78.00 ezetimibe  (ZETIA) 10 MG tablet  3. Coronary artery calcification seen on CAT scan  I25.10   4. Mild mitral regurgitation  I34.0     EKG 01/29/2020: Normal this rhythm at rate of 61 beats minute, normal axis.  Poor R progression, probably normal variant.  No evidence of ischemia, normal EKG. No significant change from  EKG 07/18/2019   Recommendations:   Roger Lynch  is 69 y.o. Caucasian male  with coronary calcification noted artery CT scan of the chest performed for coronary risk stratification in view of strong family history of premature coronary artery disease (father who had MI at age 69 years) and also uncontrolled hyperlipidemia, previously not been able to tolerate statins causing him "fogginess in brain" and severe myalgias, vitamin D deficiency, h/o basal cell carcinoma S/P 5FU therapy in Jan 2019, erectile dysfunction  Presents for a 76-month office visit and follow-up of hyperlipidemia specifically.  He is here also for continued risk factor modification.    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  This was to confirm absence of stenosis. I reassured him.   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  He has had similar symptoms of burning sensation in the mouth with walnuts, spicy food and he may be in fact having some form of allergy to some food.  He is presently tolerating Crestor but discontinued Zetia.  But he is willing to try Zetia again. In view of coronary calcification LDL goal close to <70 mg%.  I reviewed external labs.  I have discussed Nexlitol added to statin if indeed he develops worsening symptoms with Zetia.  Otherwise he is on appropriate medical therapy, continue aspirin for prophylaxis, will see PRN.      Yates Decamp, MD,  [facility] 01/29/2020, 10:56 AM [facility] [place]

## 2020-01-29 NOTE — Patient Instructions (Signed)
You will need lipid profile testing in 2 to 3 months and follow-up with his PCP for further management and Rx  I will see you as needed.

## 2020-02-19 DIAGNOSIS — H524 Presbyopia: Secondary | ICD-10-CM | POA: Diagnosis not present

## 2020-04-28 DIAGNOSIS — L570 Actinic keratosis: Secondary | ICD-10-CM | POA: Diagnosis not present

## 2020-04-28 DIAGNOSIS — D171 Benign lipomatous neoplasm of skin and subcutaneous tissue of trunk: Secondary | ICD-10-CM | POA: Diagnosis not present

## 2020-04-28 DIAGNOSIS — Z85828 Personal history of other malignant neoplasm of skin: Secondary | ICD-10-CM | POA: Diagnosis not present

## 2020-04-28 DIAGNOSIS — D2262 Melanocytic nevi of left upper limb, including shoulder: Secondary | ICD-10-CM | POA: Diagnosis not present

## 2020-05-20 DIAGNOSIS — E7849 Other hyperlipidemia: Secondary | ICD-10-CM | POA: Diagnosis not present

## 2020-06-19 ENCOUNTER — Other Ambulatory Visit: Payer: Self-pay | Admitting: Cardiology

## 2020-06-19 DIAGNOSIS — E7800 Pure hypercholesterolemia, unspecified: Secondary | ICD-10-CM

## 2020-07-20 DIAGNOSIS — Z0120 Encounter for dental examination and cleaning without abnormal findings: Secondary | ICD-10-CM | POA: Diagnosis not present

## 2020-10-26 DIAGNOSIS — R7301 Impaired fasting glucose: Secondary | ICD-10-CM | POA: Diagnosis not present

## 2020-10-26 DIAGNOSIS — Z125 Encounter for screening for malignant neoplasm of prostate: Secondary | ICD-10-CM | POA: Diagnosis not present

## 2020-10-26 DIAGNOSIS — E785 Hyperlipidemia, unspecified: Secondary | ICD-10-CM | POA: Diagnosis not present

## 2020-11-02 DIAGNOSIS — R82998 Other abnormal findings in urine: Secondary | ICD-10-CM | POA: Diagnosis not present

## 2020-11-02 DIAGNOSIS — Z1212 Encounter for screening for malignant neoplasm of rectum: Secondary | ICD-10-CM | POA: Diagnosis not present

## 2020-11-02 DIAGNOSIS — E785 Hyperlipidemia, unspecified: Secondary | ICD-10-CM | POA: Diagnosis not present

## 2020-11-02 DIAGNOSIS — I2510 Atherosclerotic heart disease of native coronary artery without angina pectoris: Secondary | ICD-10-CM | POA: Diagnosis not present

## 2020-11-02 DIAGNOSIS — N401 Enlarged prostate with lower urinary tract symptoms: Secondary | ICD-10-CM | POA: Diagnosis not present

## 2020-11-02 DIAGNOSIS — Z0000 Encounter for general adult medical examination without abnormal findings: Secondary | ICD-10-CM | POA: Diagnosis not present

## 2021-03-04 DIAGNOSIS — L720 Epidermal cyst: Secondary | ICD-10-CM | POA: Diagnosis not present

## 2021-03-04 DIAGNOSIS — Z85828 Personal history of other malignant neoplasm of skin: Secondary | ICD-10-CM | POA: Diagnosis not present

## 2021-03-04 DIAGNOSIS — L738 Other specified follicular disorders: Secondary | ICD-10-CM | POA: Diagnosis not present

## 2021-03-04 DIAGNOSIS — D2262 Melanocytic nevi of left upper limb, including shoulder: Secondary | ICD-10-CM | POA: Diagnosis not present

## 2021-03-16 ENCOUNTER — Other Ambulatory Visit: Payer: Self-pay | Admitting: Cardiology

## 2021-03-16 DIAGNOSIS — E7800 Pure hypercholesterolemia, unspecified: Secondary | ICD-10-CM
